# Patient Record
Sex: Male | Born: 1935 | Hispanic: No | Marital: Single | Smoking: Never smoker
Health system: Southern US, Community
[De-identification: ages and names within clinical notes are randomized; demographics above are authoritative.]

## PROBLEM LIST (undated history)

## (undated) DIAGNOSIS — J449 Chronic obstructive pulmonary disease, unspecified: Secondary | ICD-10-CM

## (undated) DIAGNOSIS — K5900 Constipation, unspecified: Secondary | ICD-10-CM

## (undated) DIAGNOSIS — I10 Essential (primary) hypertension: Secondary | ICD-10-CM

## (undated) DIAGNOSIS — I209 Angina pectoris, unspecified: Secondary | ICD-10-CM

## (undated) DIAGNOSIS — E079 Disorder of thyroid, unspecified: Secondary | ICD-10-CM

## (undated) DIAGNOSIS — K219 Gastro-esophageal reflux disease without esophagitis: Secondary | ICD-10-CM

## (undated) DIAGNOSIS — F419 Anxiety disorder, unspecified: Secondary | ICD-10-CM

---

## 2016-09-26 ENCOUNTER — Inpatient Hospital Stay
Admit: 2016-09-26 | Discharge: 2016-09-26 | Disposition: A | Discharge status: Home / Self Care | Payer: Medicare (Managed Care) | Attending: Internal Medicine | Admitting: Internal Medicine

## 2016-09-26 ENCOUNTER — Encounter: Payer: Self-pay | Admitting: Emergency Medicine

## 2016-09-26 ENCOUNTER — Emergency Department: Payer: Medicare (Managed Care)

## 2016-09-26 ENCOUNTER — Inpatient Hospital Stay
Admission: EM | Admit: 2016-09-26 | Discharge: 2016-09-26 | DRG: 291 | Disposition: A | Discharge status: Skilled Nursing Facility | Payer: Medicare (Managed Care) | Attending: Internal Medicine | Admitting: Internal Medicine

## 2016-09-26 DIAGNOSIS — M542 Cervicalgia: Secondary | ICD-10-CM | POA: Diagnosis present

## 2016-09-26 DIAGNOSIS — J9622 Acute and chronic respiratory failure with hypercapnia: Secondary | ICD-10-CM | POA: Diagnosis present

## 2016-09-26 DIAGNOSIS — E039 Hypothyroidism, unspecified: Secondary | ICD-10-CM | POA: Diagnosis present

## 2016-09-26 DIAGNOSIS — J810 Acute pulmonary edema: Secondary | ICD-10-CM | POA: Diagnosis present

## 2016-09-26 DIAGNOSIS — E782 Mixed hyperlipidemia: Secondary | ICD-10-CM | POA: Diagnosis present

## 2016-09-26 DIAGNOSIS — R739 Hyperglycemia, unspecified: Secondary | ICD-10-CM | POA: Diagnosis present

## 2016-09-26 DIAGNOSIS — I509 Heart failure, unspecified: Secondary | ICD-10-CM

## 2016-09-26 DIAGNOSIS — D696 Thrombocytopenia, unspecified: Secondary | ICD-10-CM | POA: Diagnosis present

## 2016-09-26 DIAGNOSIS — I5023 Acute on chronic systolic (congestive) heart failure: Secondary | ICD-10-CM | POA: Diagnosis present

## 2016-09-26 DIAGNOSIS — I10 Essential (primary) hypertension: Secondary | ICD-10-CM

## 2016-09-26 DIAGNOSIS — Z79899 Other long term (current) drug therapy: Secondary | ICD-10-CM

## 2016-09-26 DIAGNOSIS — Z9981 Dependence on supplemental oxygen: Secondary | ICD-10-CM

## 2016-09-26 DIAGNOSIS — D72829 Elevated white blood cell count, unspecified: Secondary | ICD-10-CM | POA: Diagnosis present

## 2016-09-26 DIAGNOSIS — J9602 Acute respiratory failure with hypercapnia: Secondary | ICD-10-CM

## 2016-09-26 DIAGNOSIS — R0689 Other abnormalities of breathing: Secondary | ICD-10-CM

## 2016-09-26 DIAGNOSIS — I248 Other forms of acute ischemic heart disease: Secondary | ICD-10-CM | POA: Diagnosis present

## 2016-09-26 DIAGNOSIS — I110 Hypertensive heart disease with heart failure: Secondary | ICD-10-CM | POA: Diagnosis present

## 2016-09-26 DIAGNOSIS — M546 Pain in thoracic spine: Secondary | ICD-10-CM | POA: Diagnosis present

## 2016-09-26 DIAGNOSIS — K219 Gastro-esophageal reflux disease without esophagitis: Secondary | ICD-10-CM | POA: Diagnosis present

## 2016-09-26 DIAGNOSIS — J9601 Acute respiratory failure with hypoxia: Secondary | ICD-10-CM

## 2016-09-26 DIAGNOSIS — J9621 Acute and chronic respiratory failure with hypoxia: Secondary | ICD-10-CM | POA: Diagnosis present

## 2016-09-26 DIAGNOSIS — J441 Chronic obstructive pulmonary disease with (acute) exacerbation: Secondary | ICD-10-CM | POA: Diagnosis present

## 2016-09-26 DIAGNOSIS — G9341 Metabolic encephalopathy: Secondary | ICD-10-CM | POA: Diagnosis present

## 2016-09-26 DIAGNOSIS — I2510 Atherosclerotic heart disease of native coronary artery without angina pectoris: Secondary | ICD-10-CM | POA: Diagnosis present

## 2016-09-26 DIAGNOSIS — R0603 Acute respiratory distress: Secondary | ICD-10-CM

## 2016-09-26 DIAGNOSIS — I4510 Unspecified right bundle-branch block: Secondary | ICD-10-CM | POA: Diagnosis present

## 2016-09-26 DIAGNOSIS — I5031 Acute diastolic (congestive) heart failure: Secondary | ICD-10-CM

## 2016-09-26 HISTORY — DX: Disorder of thyroid, unspecified: E07.9

## 2016-09-26 HISTORY — DX: Angina pectoris, unspecified: I20.9

## 2016-09-26 HISTORY — DX: Anxiety disorder, unspecified: F41.9

## 2016-09-26 HISTORY — DX: Chronic obstructive pulmonary disease, unspecified: J44.9

## 2016-09-26 HISTORY — DX: Essential (primary) hypertension: I10

## 2016-09-26 HISTORY — DX: Constipation, unspecified: K59.00

## 2016-09-26 HISTORY — DX: Gastro-esophageal reflux disease without esophagitis: K21.9

## 2016-09-26 LAB — BLOOD GAS, VENOUS
ACID-BASE EXCESS: 7 mmol/L — AB (ref 0.0–2.0)
BICARBONATE: 36.4 mmol/L — AB (ref 20.0–28.0)
Delivery systems: POSITIVE
FIO2: 0.4
PCO2 VEN: 83 mmHg — AB (ref 44.0–60.0)
Patient temperature: 37
pH, Ven: 7.25 (ref 7.250–7.430)

## 2016-09-26 LAB — COMPREHENSIVE METABOLIC PANEL
ALT: 15 U/L — AB (ref 17–63)
AST: 28 U/L (ref 15–41)
Albumin: 3.4 g/dL — ABNORMAL LOW (ref 3.5–5.0)
Alkaline Phosphatase: 51 U/L (ref 38–126)
Anion gap: 8 (ref 5–15)
BILIRUBIN TOTAL: 0.9 mg/dL (ref 0.3–1.2)
BUN: 20 mg/dL (ref 6–20)
CHLORIDE: 101 mmol/L (ref 101–111)
CO2: 31 mmol/L (ref 22–32)
CREATININE: 1.16 mg/dL (ref 0.61–1.24)
Calcium: 8.4 mg/dL — ABNORMAL LOW (ref 8.9–10.3)
GFR, EST NON AFRICAN AMERICAN: 52 mL/min — AB (ref >60)
Glucose, Bld: 142 mg/dL — ABNORMAL HIGH (ref 65–99)
POTASSIUM: 4.8 mmol/L (ref 3.5–5.1)
Sodium: 140 mmol/L (ref 135–145)
TOTAL PROTEIN: 6.6 g/dL (ref 6.5–8.1)

## 2016-09-26 LAB — AMMONIA: Ammonia: 14 umol/L (ref 9–35)

## 2016-09-26 LAB — TROPONIN I
TROPONIN I: 0.13 ng/mL — AB (ref <0.03)
Troponin I: 0.14 ng/mL (ref <0.03)

## 2016-09-26 LAB — HEMOGLOBIN A1C
Hgb A1c MFr Bld: 5.3 % (ref 4.8–5.6)
Hgb A1c MFr Bld: 5.3 % (ref 4.8–5.6)
MEAN PLASMA GLUCOSE: 105.41 mg/dL
Mean Plasma Glucose: 105.41 mg/dL

## 2016-09-26 LAB — PROTIME-INR
INR: 0.99
Prothrombin Time: 13.1 seconds (ref 11.4–15.2)

## 2016-09-26 LAB — CBC WITH DIFFERENTIAL/PLATELET
BASOS ABS: 0.1 10*3/uL (ref 0–0.1)
Basophils Relative: 1 %
EOS ABS: 0 10*3/uL (ref 0–0.7)
Eosinophils Relative: 0 %
HCT: 30.9 % — ABNORMAL LOW (ref 40.0–52.0)
Hemoglobin: 10 g/dL — ABNORMAL LOW (ref 13.0–18.0)
LYMPHS ABS: 0.9 10*3/uL — AB (ref 1.0–3.6)
Lymphocytes Relative: 7 %
MCH: 30.6 pg (ref 26.0–34.0)
MCHC: 32.5 g/dL (ref 32.0–36.0)
MCV: 94.1 fL (ref 80.0–100.0)
MONOS PCT: 4 %
Monocytes Absolute: 0.5 10*3/uL (ref 0.2–1.0)
Neutro Abs: 11 10*3/uL — ABNORMAL HIGH (ref 1.4–6.5)
Neutrophils Relative %: 88 %
PLATELETS: 132 10*3/uL — AB (ref 150–440)
RBC: 3.29 MIL/uL — AB (ref 4.40–5.90)
RDW: 15.7 % — AB (ref 11.5–14.5)
WBC: 12.5 10*3/uL — AB (ref 3.8–10.6)

## 2016-09-26 LAB — GLUCOSE, CAPILLARY
GLUCOSE-CAPILLARY: 139 mg/dL — AB (ref 65–99)
Glucose-Capillary: 116 mg/dL — ABNORMAL HIGH (ref 65–99)

## 2016-09-26 LAB — BRAIN NATRIURETIC PEPTIDE

## 2016-09-26 LAB — MRSA PCR SCREENING: MRSA by PCR: NEGATIVE

## 2016-09-26 LAB — APTT: aPTT: 30 seconds (ref 24–36)

## 2016-09-26 LAB — PROCALCITONIN

## 2016-09-26 MED ORDER — NITROGLYCERIN IN D5W 200-5 MCG/ML-% IV SOLN
0.0000 ug/min | Freq: Once | INTRAVENOUS | Status: AC
  Administered 2016-09-26: 30 ug/min via INTRAVENOUS

## 2016-09-26 MED ORDER — FUROSEMIDE 20 MG PO TABS: 40.0000 mg | ORAL_TABLET | Freq: Every day | ORAL | Status: DC

## 2016-09-26 MED ORDER — CHLORHEXIDINE GLUCONATE 0.12 % MT SOLN
15.0000 mL | Freq: Two times a day (BID) | OROMUCOSAL | Status: DC
  Administered 2016-09-26: 15 mL via OROMUCOSAL
  Filled 2016-09-26: qty 15

## 2016-09-26 MED ORDER — SODIUM CHLORIDE 0.9 % IV SOLN: 250.0000 mL | INTRAVENOUS | Status: DC | PRN

## 2016-09-26 MED ORDER — SODIUM CHLORIDE 0.9% FLUSH
3.0000 mL | Freq: Two times a day (BID) | INTRAVENOUS | Status: DC
  Administered 2016-09-26: 3 mL via INTRAVENOUS

## 2016-09-26 MED ORDER — ORAL CARE MOUTH RINSE: 15.0000 mL | Freq: Two times a day (BID) | OROMUCOSAL | Status: DC

## 2016-09-26 MED ORDER — METHYLPREDNISOLONE SODIUM SUCC 125 MG IJ SOLR
125.0000 mg | Freq: Once | INTRAMUSCULAR | Status: AC
  Administered 2016-09-26: 125 mg via INTRAVENOUS
  Filled 2016-09-26: qty 2

## 2016-09-26 MED ORDER — BUDESONIDE 0.25 MG/2ML IN SUSP
0.2500 mg | Freq: Two times a day (BID) | RESPIRATORY_TRACT | Status: DC
  Administered 2016-09-26: 0.25 mg via RESPIRATORY_TRACT
  Filled 2016-09-26: qty 2

## 2016-09-26 MED ORDER — MAGNESIUM SULFATE IN D5W 1-5 GM/100ML-% IV SOLN
1.0000 g | INTRAVENOUS | Status: AC
Start: 2016-09-26 — End: 2016-09-26
  Administered 2016-09-26: 1 g via INTRAVENOUS
  Filled 2016-09-26: qty 100

## 2016-09-26 MED ORDER — NITROGLYCERIN IN D5W 200-5 MCG/ML-% IV SOLN
0.0000 ug/min | INTRAVENOUS | Status: DC
  Administered 2016-09-26: 30 ug/min via INTRAVENOUS

## 2016-09-26 MED ORDER — SODIUM CHLORIDE 0.9% FLUSH
3.0000 mL | INTRAVENOUS | Status: DC | PRN
Start: 2016-09-26 — End: 2016-09-26

## 2016-09-26 MED ORDER — METHYLPREDNISOLONE SODIUM SUCC 40 MG IJ SOLR: 40.0000 mg | Freq: Two times a day (BID) | INTRAMUSCULAR | Status: DC

## 2016-09-26 MED ORDER — HYDRALAZINE HCL 20 MG/ML IJ SOLN
10.0000 mg | Freq: Once | INTRAMUSCULAR | Status: AC
  Administered 2016-09-26: 10 mg via INTRAVENOUS
  Filled 2016-09-26: qty 1

## 2016-09-26 MED ORDER — LEVOFLOXACIN 750 MG PO TABS
750.0000 mg | ORAL_TABLET | ORAL | Status: DC
  Administered 2016-09-26: 750 mg via ORAL
  Filled 2016-09-26: qty 1

## 2016-09-26 MED ORDER — DEXTROSE 5 % IV SOLN
500.0000 mg | INTRAVENOUS | Status: DC
  Filled 2016-09-26: qty 500

## 2016-09-26 MED ORDER — ONDANSETRON HCL 4 MG/2ML IJ SOLN: 4.0000 mg | Freq: Four times a day (QID) | INTRAMUSCULAR | Status: DC | PRN

## 2016-09-26 MED ORDER — ACETAMINOPHEN 650 MG RE SUPP: 650.0000 mg | Freq: Four times a day (QID) | RECTAL | Status: DC | PRN

## 2016-09-26 MED ORDER — FUROSEMIDE 40 MG PO TABS: 40.0000 mg | ORAL_TABLET | Freq: Every day | ORAL | 1 refills | Status: AC

## 2016-09-26 MED ORDER — ENOXAPARIN SODIUM 40 MG/0.4ML ~~LOC~~ SOLN
40.0000 mg | SUBCUTANEOUS | Status: DC
  Administered 2016-09-26: 40 mg via SUBCUTANEOUS
  Filled 2016-09-26: qty 0.4

## 2016-09-26 MED ORDER — FUROSEMIDE 10 MG/ML IJ SOLN
40.0000 mg | Freq: Once | INTRAMUSCULAR | Status: AC
  Administered 2016-09-26: 40 mg via INTRAVENOUS
  Filled 2016-09-26: qty 4

## 2016-09-26 MED ORDER — ACETAMINOPHEN 325 MG PO TABS: 650.0000 mg | ORAL_TABLET | Freq: Four times a day (QID) | ORAL | Status: DC | PRN

## 2016-09-26 MED ORDER — ASPIRIN 325 MG PO TBEC: 325.0000 mg | DELAYED_RELEASE_TABLET | Freq: Every day | ORAL | 0 refills | Status: AC

## 2016-09-26 MED ORDER — TIOTROPIUM BROMIDE MONOHYDRATE 18 MCG IN CAPS
18.0000 ug | ORAL_CAPSULE | Freq: Every day | RESPIRATORY_TRACT | Status: DC
  Filled 2016-09-26: qty 5

## 2016-09-26 MED ORDER — NITROGLYCERIN IN D5W 200-5 MCG/ML-% IV SOLN
30.0000 ug/min | Freq: Once | INTRAVENOUS | Status: AC
  Administered 2016-09-26: 30 ug/min via INTRAVENOUS
  Filled 2016-09-26: qty 250

## 2016-09-26 MED ORDER — ALBUTEROL SULFATE (2.5 MG/3ML) 0.083% IN NEBU
5.0000 mg | INHALATION_SOLUTION | Freq: Once | RESPIRATORY_TRACT | Status: AC
  Administered 2016-09-26: 5 mg via RESPIRATORY_TRACT
  Filled 2016-09-26: qty 6

## 2016-09-26 MED ORDER — IPRATROPIUM-ALBUTEROL 0.5-2.5 (3) MG/3ML IN SOLN
3.0000 mL | Freq: Once | RESPIRATORY_TRACT | Status: AC
  Administered 2016-09-26: 3 mL via RESPIRATORY_TRACT
  Filled 2016-09-26: qty 6

## 2016-09-26 MED ORDER — DEXTROSE 5 % IV SOLN
1.0000 g | INTRAVENOUS | Status: DC
  Filled 2016-09-26: qty 10

## 2016-09-26 MED ORDER — ASPIRIN EC 325 MG PO TBEC: 325.0000 mg | DELAYED_RELEASE_TABLET | Freq: Every day | ORAL | Status: DC

## 2016-09-26 MED ORDER — METOPROLOL TARTRATE 25 MG PO TABS: 12.5000 mg | ORAL_TABLET | Freq: Two times a day (BID) | ORAL | Status: DC

## 2016-09-26 MED ORDER — INSULIN ASPART 100 UNIT/ML ~~LOC~~ SOLN: 0.0000 [IU] | SUBCUTANEOUS | Status: DC

## 2016-09-26 MED ORDER — FUROSEMIDE 10 MG/ML IJ SOLN: 40.0000 mg | Freq: Once | INTRAMUSCULAR | Status: DC

## 2016-09-26 MED ORDER — ALBUTEROL SULFATE (2.5 MG/3ML) 0.083% IN NEBU
2.5000 mg | INHALATION_SOLUTION | Freq: Four times a day (QID) | RESPIRATORY_TRACT | Status: DC
  Administered 2016-09-26 (×2): 2.5 mg via RESPIRATORY_TRACT
  Filled 2016-09-26 (×2): qty 3

## 2016-09-26 MED ORDER — ONDANSETRON HCL 4 MG PO TABS: 4.0000 mg | ORAL_TABLET | Freq: Four times a day (QID) | ORAL | Status: DC | PRN

## 2016-09-26 MED ORDER — ENALAPRILAT 1.25 MG/ML IV SOLN: 1.2500 mg | Freq: Once | INTRAVENOUS | Status: DC

## 2016-09-26 NOTE — Care Management (Signed)
DNR form received by ICU RN from Dr. Victory Dakin [facility] provider.Code status updated in EPIC to DNR. Patient is on supplemental O2 at 6 Liter/[place] per ICU RN. CSW updated.

## 2016-09-26 NOTE — Progress Notes (Signed)
Pt off BiPap. On 6 liters [place], passed bedside swallow screen w/o s/sx distress

## 2016-09-26 NOTE — ED Notes (Signed)
Attempted to call report x 1  

## 2016-09-26 NOTE — Consult Note (Signed)
[facility] Cardiology Consultation Note  Patient ID: Richard Odom, MRN: [id number], DOB/AGE: 1920-10-22 83 y.o. Admit date: 09/26/2016   Date of Consult: 09/26/2016 Primary Physician: Dorothey Baseman, MD Primary Cardiologist: None  Chief Complaint:  Chief Complaint  Patient presents with  . Respiratory Distress   Reason for Consult: congestive heart failure  HPI: 83 y.o. male with known chronic obstructive pulmonary disease essential hypertension mixed hyperlipidemia who has had chronic systolic dysfunction heart failure and now has had severe shortness of breath weakness fatigue and chest discomfort over a 24-hour period. The patient has had a chest x-ray and further evaluation with a with suggestion of pulmonary edema and congestive heart failure. EKG has shown normal sinus rhythm with left atrial enlargement and right bundle branch block and inferior infarct age undetermined. This suggests previous coronary artery disease. The patient has had some significant diuresis and his breathing much better with less oxygen necessity. Previously the patient has been on appropriate medication management for risk factor modification including metoprolol and furosemide ACE inhibitor and aspirin.  Past Medical History:  Diagnosis Date  . Angina pectoris (HCC)   . Anxiety   . Constipation   . COPD (chronic obstructive pulmonary disease) (HCC)   . GERD (gastroesophageal reflux disease)   . Hypertension   . Thyroid disease       Surgical History: No past surgical history on file.   Home Meds: Prior to Admission medications   Medication Sig Start Date End Date Taking? Authorizing Provider  carvedilol (COREG CR) 10 MG 24 hr capsule Take 10 mg by mouth daily.   Yes [provider]  levothyroxine (SYNTHROID, LEVOTHROID) 25 MCG tablet Take 25 mcg by mouth daily before breakfast.   Yes [provider]  umeclidinium-vilanterol (ANORO ELLIPTA) 62.5-25 MCG/INH AEPB Inhale 1 puff  into the lungs daily.   Yes [provider]  acetaminophen (TYLENOL) 500 MG tablet Take 500 mg by mouth every 12 (twelve) hours as needed.    [provider]  albuterol (PROVENTIL HFA;VENTOLIN HFA) 108 (90 Base) MCG/ACT inhaler Inhale 2 puffs into the lungs every 4 (four) hours as needed for wheezing or shortness of breath.    [provider]  alum & mag hydroxide-simeth (GERI-LANTA) 200-200-20 MG/5ML suspension Take 10 mLs by mouth every 6 (six) hours as needed for indigestion or heartburn.    [provider]  dextromethorphan-guaiFENesin (MUCINEX DM) 30-600 MG 12hr tablet Take 1 tablet by mouth 2 (two) times daily as needed for cough.    [provider]  ipratropium-albuterol (DUONEB) 0.5-2.5 (3) MG/3ML SOLN Take 3 mLs by nebulization every 4 (four) hours as needed.    [provider]  lactulose (CHRONULAC) 10 GM/15ML solution Take 10 g by mouth daily as needed for mild constipation.    [provider]  loperamide (IMODIUM) 2 MG capsule Take 2 mg by mouth as needed for diarrhea or loose stools.    [provider]  LORazepam (ATIVAN) 2 MG/ML concentrated solution Take 0.5 mg by mouth every 2 (two) hours as needed for anxiety.    [provider]  magnesium hydroxide (MILK OF MAGNESIA) 400 MG/5ML suspension Take 15 mLs by mouth 2 (two) times daily as needed for mild constipation.    [provider]  Morphine Sulfate (MORPHINE CONCENTRATE) 10 mg / 0.5 ml concentrated solution Take by mouth every 2 (two) hours as needed for severe pain.    [provider]  nitroGLYCERIN (NITROSTAT) 0.4 MG SL tablet Place 0.4  mg under the tongue every 5 (five) minutes as needed for chest pain.    [provider]  ondansetron (ZOFRAN) 4 MG tablet Take 4 mg by mouth every 8 (eight) hours as needed for nausea or vomiting.    [provider]  sennosides-docusate sodium (SENOKOT-S) 8.6-50 MG tablet Take 1 tablet  by mouth daily as needed for constipation.    [provider]  sodium chloride (OCEAN) 0.65 % SOLN nasal spray Place 2 sprays into both nostrils every 4 (four) hours as needed for congestion.    [provider]    Inpatient Medications:  . albuterol  2.5 mg Nebulization Q6H  . aspirin EC  325 mg Oral Daily  . budesonide (PULMICORT) nebulizer solution  0.25 mg Nebulization BID  . chlorhexidine  15 mL Mouth Rinse BID  . enalaprilat  1.25 mg Intravenous Once  . enoxaparin (LOVENOX) injection  40 mg Subcutaneous Q24H  . insulin aspart  0-15 Units Subcutaneous Q4H  . mouth rinse  15 mL Mouth Rinse q12n4p  . methylPREDNISolone (SOLU-MEDROL) injection  40 mg Intravenous Q12H  . metoprolol tartrate  12.5 mg Oral BID  . sodium chloride flush  3 mL Intravenous Q12H   . sodium chloride    . [START ON 09/27/2016] azithromycin    . [START ON 09/27/2016] cefTRIAXone (ROCEPHIN)  IV    . nitroGLYCERIN 30 mcg/min (09/26/16 1209)    Allergies: No Known Allergies  Social History   Social History  . Marital status: Single    Spouse name: N/A  . Number of children: N/A  . Years of education: N/A   Occupational History  . Not on file.   Social History Main Topics  . Smoking status: Never Smoker  . Smokeless tobacco: Never Used  . Alcohol use No  . Drug use: No  . Sexual activity: Not on file   Other Topics Concern  . Not on file   Social History Narrative  . No narrative on file     No family history on file.   Review of Systems Positive for Shortness of breath cough congestion Negative for: General:  chills, fever, night sweats or weight changes.  Cardiovascular: PND orthopnea syncope dizziness  Dermatological skin lesions rashes Respiratory: Positive for Cough congestion Urologic: Frequent urination urination at night and hematuria Abdominal: negative for nausea, vomiting, diarrhea, bright red blood per rectum, melena, or hematemesis Neurologic: negative for  visual changes, and/or hearing changes  All other systems reviewed and are otherwise negative except as noted above.  Labs:  Recent Labs  09/26/16 0704 09/26/16 1055  TROPONINI 0.13* 0.14*   Lab Results  Component Value Date   WBC 12.5 (H) 09/26/2016   HGB 10.0 (L) 09/26/2016   HCT 30.9 (L) 09/26/2016   MCV 94.1 09/26/2016   PLT 132 (L) 09/26/2016    Recent Labs Lab 09/26/16 0704  NA 140  K 4.8  CL 101  CO2 31  BUN 20  CREATININE 1.16  CALCIUM 8.4*  PROT 6.6  BILITOT 0.9  ALKPHOS 51  ALT 15*  AST 28  GLUCOSE 142*   No results found for: CHOL, HDL, LDLCALC, TRIG No results found for: DDIMER  Radiology/Studies:  Dg Chest Portable 1 View  Result Date: 09/26/2016 CLINICAL DATA:  Shortness of breath, hypoxia.  History of COPD. EXAM: PORTABLE CHEST 1 VIEW COMPARISON:  None in PACs FINDINGS: The lungs are reasonably well inflated. Increased density is noted at both lung bases and there are small bilateral  pleural effusions. The cardiac silhouette is the large but the heart borders are indistinct. The pulmonary vascularity is mildly engorged. There is calcification in the wall of the aortic arch. The bony thorax exhibits no acute abnormality. IMPRESSION: CHF with interstitial edema and bilateral pleural effusions. Coarse bibasilar lung markings may reflect atelectasis or pneumonia. When the patient can tolerate the procedure, a PA and lateral chest x-ray would be useful. Thoracic aortic atherosclerosis. Electronically Signed   By: David  [place] M.D.   On: 09/26/2016 07:22    EKG: Normal sinus rhythm with left atrial enlargement and right bundle branch block with inferior infarct age undetermined  Weights: Filed Weights   09/26/16 0702 09/26/16 1000  Weight: 72.6 kg (160 lb) 61.2 kg (134 lb 14.7 oz)     Physical Exam: Blood pressure (!) 139/57, pulse 68, temperature 97.8 F (36.6 C), temperature source Axillary, resp. rate (!) 25, height 5\' 5"  (1.651 m), weight 61.2 kg  (134 lb 14.7 oz), SpO2 95 %. Body mass index is 22.45 kg/m. General: Well developed, well nourished, in no acute distress. Head eyes ears nose throat: Normocephalic, atraumatic, sclera non-icteric, no xanthomas, nares are without discharge. No apparent thyromegaly and/or mass  Lungs: Normal respiratory effort.  no wheezes, Basilar rales, no rhonchi.  Heart: RRR with normal S1 S2. no murmur gallop, no rub, PMI is normal size and placement, carotid upstroke normal without bruit, jugular venous pressure is normal Abdomen: Soft, non-tender, non-distended with normoactive bowel sounds. No hepatomegaly. No rebound/guarding. No obvious abdominal masses. Abdominal aorta is normal size without bruit Extremities: 1+ edema. no cyanosis, no clubbing, no ulcers  Peripheral : 2+ bilateral upper extremity pulses, 2+ bilateral femoral pulses, 2+ bilateral dorsal pedal pulse Neuro: Alert and oriented. No facial asymmetry. No focal deficit. Moves all extremities spontaneously. Musculoskeletal: Normal muscle tone without kyphosis Psych:  Responds to questions appropriately with a normal affect.    Assessment: 83 year old male with the acute on chronic systolic dysfunction congestive heart failure cardiovascular disease essential hypertension mixed hyperlipidemia no with elevated troponin consistent with demand ischemia rather than acute coronary syndrome  Plan: 1. Continue intravenous Lasix for further risk reduction and treatment of congestive heart failure 2. Consider echocardiogram for further changes in LV systolic dysfunction valvular heart disease contributing to above 3. ACE inhibitor beta blocker and aspirin for further risk reduction cardiovascular disease and/or congestive heart failure 4. Begin ambulation when able following for further significant symptoms and further need for adjustments of medication management 5. No further intervention of minimal elevation of troponin more consistent with demand  ischemia rather than acute coronary syndrome  Signed, Lamar Blinks M.D. [facility] Cardiology 09/26/2016, 1:03 PM

## 2016-09-26 NOTE — Progress Notes (Signed)
Report called to Kim at [facility].  Pt will transfer via EMS.  Transfer packet at the desk. Patient on 6 liters [place], EMS informed.  Writer dressed patient in his clothing and removed his IVs, bleeding controlled.  No pain issues; he is waiting in his room for EMS.  Oxygen tubing, shoes and socks and quad cane at bed side to go with him to [facility].

## 2016-09-26 NOTE — ED Notes (Signed)
Richard Odom, from [facility], at bedside. Requesting her number to be left.  [phone].

## 2016-09-26 NOTE — ED Triage Notes (Signed)
Patient presents to ED via [facility] from [facility], emergency traffic. Staff at [facility] called out due to patients SpO2 80% on RA. History of COPD. Patient wears 4L of O2 chronically. Patient placed on bipap upon arrival to ED. EMS placed 1.5 inch of nitro paste to patients chest. EMS report end tidal CO2 30.

## 2016-09-26 NOTE — Progress Notes (Signed)
Per Richard Odom I&O cath patient for 554 mls in bladder, he cannot void after multiple attempts

## 2016-09-26 NOTE — ED Notes (Signed)
1.5 inch of nitro paste removed from patients left chest by this RN per verbal order from Dr. Scotty Court prior to nitro gtt.

## 2016-09-26 NOTE — Progress Notes (Signed)
*  PRELIMINARY RESULTS* Echocardiogram 2D Echocardiogram has been performed.  Richard Odom 09/26/2016, 1:48 PM

## 2016-09-26 NOTE — ED Provider Notes (Addendum)
[facility] Emergency Department Provider Note  ____________________________________________  Time seen: Approximately 7:43 AM  I have reviewed the triage vital signs and the nursing notes.   HISTORY  Chief Complaint Respiratory Distress  Level 5 caveat:  Portions of the history and physical were unable to be obtained due to: Respiratory distress and critical illness   HPI Richard Odom is a 83 y.o. male sent to the ED from [facility] due to respiratory distress. Oxygen saturation was 80% on room air. Has a history of COPD and wears 4 L nasal cannula at home. Today his breathing was much worse with shortness of breath that started overnight associated with severe upper back pain. No cough fever or chills. No aggravating or alleviating factors. EMS put the patient on CPAP and placed Nitropaste to the chest.   Patient is full code  Past Medical History:  Diagnosis Date  . Angina pectoris (HCC)   . Anxiety   . Constipation   . COPD (chronic obstructive pulmonary disease) (HCC)   . GERD (gastroesophageal reflux disease)   . Hypertension   . Thyroid disease      There are no active problems to display for this patient.    No past surgical history on file. Noncontributory  Prior to Admission medications   Not on File   Albuterol Synthroid Nitrostat Lactulose Ativan when necessary Coreg   Allergies Patient has no known allergies.   No family history on file.  Social History Social History  Substance Use Topics  . Smoking status: Never Smoker  . Smokeless tobacco: Never Used  . Alcohol use No    Review of Systems  Constitutional:   No fever or chills.   Cardiovascular:   No chest pain or syncope. Respiratory:  Positive shortness of breath without cough. Gastrointestinal:   Negative for abdominal pain, vomiting and diarrhea.  Musculoskeletal:   Positive upper back pain, no swelling All other systems reviewed and are  negative except as documented above in ROS and HPI.  ____________________________________________   PHYSICAL EXAM:  VITAL SIGNS: ED Triage Vitals  Enc Vitals Group     BP 09/26/16 0710 (!) 172/64     Pulse Rate 09/26/16 0700 65     Resp 09/26/16 0700 (!) 22     Temp 09/26/16 0700 (!) 97.3 F (36.3 C)     Temp Source 09/26/16 0700 Axillary     SpO2 09/26/16 0700 92 %     Weight 09/26/16 0702 160 lb (72.6 kg)     Height 09/26/16 0702 5\' 7"  (1.702 m)     Head Circumference --      [facility] Flow --      Pain Score 09/26/16 0713 10     Pain Loc --      Pain Edu? --      Excl. in GC? --     Vital signs reviewed, nursing assessments reviewed.   Constitutional:   Alert and oriented. Respiratory distress Eyes:   No scleral icterus.  EOMI. No nystagmus. No conjunctival pallor. PERRL. ENT   Head:   Normocephalic and atraumatic.   Nose:   No congestion/rhinnorhea.    Mouth/Throat:   MMM, no pharyngeal erythema. No peritonsillar mass.    Neck:   No meningismus. Full ROM Hematological/Lymphatic/Immunilogical:   No cervical lymphadenopathy. Cardiovascular:   RRR. Symmetric bilateral radial and DP pulses.  No murmurs.  Respiratory:   Increased work of breathing with tachypnea. Diminished breath sounds in all lung fields, diffuse  expiratory wheezing. Crackles in both bases.. Gastrointestinal:   Soft and nontender. Non distended. There is no CVA tenderness.  No rebound, rigidity, or guarding. Genitourinary:   deferred Musculoskeletal:   Normal range of motion in all extremities. No joint effusions.  No lower extremity tenderness.  No edema. Neurologic:   Normal speech and language.  Motor grossly intact. No gross focal neurologic deficits are appreciated.  Skin:    Skin is warm, dry and intact. No rash noted.  No petechiae, purpura, or bullae.  ____________________________________________    LABS (pertinent positives/negatives) (all labs ordered are listed, but only  abnormal results are displayed) Labs Reviewed  BLOOD GAS, VENOUS - Abnormal; Notable for the following:       Result Value   pCO2, Ven 83 (*)    Bicarbonate 36.4 (*)    Acid-Base Excess 7.0 (*)    All other components within normal limits  CBC WITH DIFFERENTIAL/PLATELET - Abnormal; Notable for the following:    WBC 12.5 (*)    RBC 3.29 (*)    Hemoglobin 10.0 (*)    HCT 30.9 (*)    RDW 15.7 (*)    Platelets 132 (*)    All other components within normal limits  COMPREHENSIVE METABOLIC PANEL  TROPONIN I   ____________________________________________   EKG  Interpreted by me Sinus rhythm rate of 59, normal axis, slightly widened QRS, right bundle branch block. No acute ischemic changes. Poor baseline somewhat limits interpretation, but not consistent with STEMI.  ____________________________________________    RADIOLOGY  Dg Chest Portable 1 View  Result Date: 09/26/2016 CLINICAL DATA:  Shortness of breath, hypoxia.  History of COPD. EXAM: PORTABLE CHEST 1 VIEW COMPARISON:  None in PACs FINDINGS: The lungs are reasonably well inflated. Increased density is noted at both lung bases and there are small bilateral pleural effusions. The cardiac silhouette is the large but the heart borders are indistinct. The pulmonary vascularity is mildly engorged. There is calcification in the wall of the aortic arch. The bony thorax exhibits no acute abnormality. IMPRESSION: CHF with interstitial edema and bilateral pleural effusions. Coarse bibasilar lung markings may reflect atelectasis or pneumonia. When the patient can tolerate the procedure, a PA and lateral chest x-ray would be useful. Thoracic aortic atherosclerosis. Electronically Signed   By: David  [place] M.D.   On: 09/26/2016 07:22    ____________________________________________   PROCEDURES Procedures CRITICAL CARE Performed by: Scotty Court, Aryssa Rosamond   Total critical care time: 35 minutes  Critical care time was exclusive of  separately billable procedures and treating other patients.  Critical care was necessary to treat or prevent imminent or life-threatening deterioration.  Critical care was time spent personally by me on the following activities: development of treatment plan with patient and/or surrogate as well as nursing, discussions with consultants, evaluation of patient's response to treatment, examination of patient, obtaining history from patient or surrogate, ordering and performing treatments and interventions, ordering and review of laboratory studies, ordering and review of radiographic studies, pulse oximetry and re-evaluation of patient's condition.  ____________________________________________   INITIAL IMPRESSION / ASSESSMENT AND PLAN / ED COURSE  Pertinent labs & imaging results that were available during my care of the patient were reviewed by me and considered in my medical decision making (see chart for details).    Clinical Course as of Sep 26 741  Thu Sep 26, 2016  0702 P/w respiratory distress, diminished air movement and wheezy, suspect COPD exac, ?pulm. Edema. Will f/u CXR and response to BDs.   [  PS]    Clinical Course User Index [PS] Sharman Cheek, MD     ----------------------------------------- 7:51 AM on 09/26/2016 -----------------------------------------  Patient arrived on CPAP from EMS, transition immediately to BiPAP on arrival. Continue nitro paste, bronchodilators, steroids. Chest x-ray consistent with pulmonary edema. Added on IV Lasix and IV enalapril. Case discussed with hospitalist for admission.   ----------------------------------------- 8:04 AM on 09/26/2016 -----------------------------------------  Troponin elevated. Non-STEMI versus strain. We'll replace the Nitropaste with a nitro drip neck and be titrated to pain relief and blood pressure control for cardiac protection and relief of potential ischemia. We'll defer heparinization for  now.. ____________________________________________   FINAL CLINICAL IMPRESSION(S) / ED DIAGNOSES  Final diagnoses:  Respiratory distress  Acute pulmonary edema (HCC)  Acute respiratory failure with hypoxia and hypercapnia (HCC)  Acute on chronic congestive heart failure, unspecified heart failure type [facility])      New Prescriptions   No medications on file     Portions of this note were generated with dragon dictation software. Dictation errors may occur despite best attempts at proofreading.    Sharman Cheek, MD 09/26/16 1610    Sharman Cheek, MD 09/26/16 [phone]

## 2016-09-26 NOTE — Progress Notes (Signed)
Anticoagulation Monitoring:   58 you male ordered enoxaparin 30mg  Daily for DVT prophylaxis. Patients estimated creatinine clearance > 79mL/min. Will transition patient to enoxaparin 40mg  Daily.   Pharmacy will continue to monitor and adjust per protocol.   MLS

## 2016-09-26 NOTE — ED Notes (Signed)
Bed assigned

## 2016-09-26 NOTE — ED Notes (Signed)
X-ray at bedside

## 2016-09-26 NOTE — Care Management (Addendum)
RNCM received call from [facility] Lajuana Matte MD [phone]. He is from [facility] and was supposed to be on comfort care. They are disappointed that he was sent here for treatment. This is the second time this has occurred per Dr. Victory Dakin. Per provider they have had at length conversations with family and they have agreed to comfort care. Per Dr. Victory Dakin she would like to talk with ICU provider and get patient back to [facility] on Bipap (which she states she can arrange) today.  I have forwarded this request to CSW and Annabelle Harman NP.  ICU RN updated. Conflict with code status per Dr. Victory Dakin. Per Dr. Victory Dakin patient does not have capacity to make health care decisions and that he is supposed to be a DNR. MOST from brought with patient "updated on 09/13/16" does not have MD/physician signature so it is not valid. I spoke with Dr. Victory Dakin about this information and she will fax current code status for this patient. Per RN charge patient speaks some English however it is difficult to tell if he has capacity.

## 2016-09-26 NOTE — ED Notes (Signed)
Attempting to call report at this time. 

## 2016-09-26 NOTE — Progress Notes (Signed)
Pharmacy Antibiotic Note  Richard Odom is a 83 y.o. male admitted on 09/26/2016 with pneumonia.  Pharmacy has been consulted for levofloxacin dosing.  Plan: Will transition patient to levofloxacin 750mg  PO Q48hr. Will discuss on rounds utility of levofloxacin in setting of possible CHF exacerbation.   Height: 5\' 7"  (170.2 cm) Weight: 160 lb (72.6 kg) IBW/kg (Calculated) : 66.1  Temp (24hrs), Avg:97.3 F (36.3 C), Min:97.3 F (36.3 C), Max:97.3 F (36.3 C)   Recent Labs Lab 09/26/16 0704  WBC 12.5*  CREATININE 1.16    Estimated Creatinine Clearance: 36.4 mL/min (by C-G formula based on SCr of 1.16 mg/dL).    No Known Allergies  Antimicrobials this admission: levofloxacin 8/23 >>    Dose adjustments this admission: 8/23 levofloxacin transitioned to 750mg  PO Q48hr.   Microbiology results: 8/23 MRSA PCR: pending  8/23 Procalcitonin: pending   Thank you for allowing pharmacy to be a part of this patient's care.  Simpson,Michael L 09/26/2016 9:36 AM

## 2016-09-26 NOTE — H&P (Addendum)
[facility] - [place] at [facility]   PATIENT NAME: Richard Odom    MR#:  [id number]  DATE OF BIRTH:  [birth year]  DATE OF ADMISSION:  09/26/2016  PRIMARY CARE PHYSICIAN: Dorothey Baseman, MD   REQUESTING/REFERRING PHYSICIAN:   CHIEF COMPLAINT:   Chief Complaint  Patient presents with  . Respiratory Distress    HISTORY OF PRESENT ILLNESS: Richard Odom  is a 83 y.o. male with a known history of COPD, HTN, Angina, who presents to the hospital with complaints of back and neck pain, which started yesterday evening. He denied any chest pains, however, admitted of back and neck pains mostly, he is not able to provide much more history sincce he is confused. He was noted to be hypoxic, tachypneic, initiated on BiPAP. He feels somewhat better now. His blood pressure was noted to be 170-180 systolic. Patient's x-ray revealed congestive heart failure, questionable atelectasis versus pneumonia. Patient denies any cough or phlegm production. Hospitalist services were contacted for admission  PAST MEDICAL HISTORY:   Past Medical History:  Diagnosis Date  . Angina pectoris (HCC)   . Anxiety   . Constipation   . COPD (chronic obstructive pulmonary disease) (HCC)   . GERD (gastroesophageal reflux disease)   . Hypertension   . Thyroid disease     PAST SURGICAL HISTORY: No past surgical history on file.  SOCIAL HISTORY:  Social History  Substance Use Topics  . Smoking status: Never Smoker  . Smokeless tobacco: Never Used  . Alcohol use No    FAMILY HISTORY: No family history on file.  DRUG ALLERGIES: No Known Allergies  Review of Systems  Unable to perform ROS: Mental status change    MEDICATIONS AT HOME:  Prior to Admission medications   Not on File      PHYSICAL EXAMINATION:   VITAL SIGNS: Blood pressure (!) 172/70, pulse (!) 105, temperature (!) 97.3 F (36.3 C), temperature source Axillary, resp. rate (!) 22, height 5\' 7"  (1.702 m),  weight 72.6 kg (160 lb), SpO2 100 %.  GENERAL:  83 y.o.-year-old patient lying in the bed In moderate respiratory distress, on BiPAP, tachypneic, uncomfortable.  EYES: Pupils equal, round, reactive to light and accommodation. No scleral icterus. Extraocular muscles intact.  HEENT: Head atraumatic, normocephalic. Oropharynx and nasopharynx clear.  NECK:  Supple, no jugular venous distention. No thyroid enlargement, no tenderness.  LUNGS: Diminished breath sounds bilaterally, scattered wheezing, bilateral rales,rhonchi and crepitations all lung fields. Intermittent use of accessory muscles of respiration, with speech or movements.  CARDIOVASCULAR: S1, S2 normal, distant. No murmurs, rubs, or gallops.  ABDOMEN: Soft, nontender, nondistended, full bladder. Bowel sounds present. No organomegaly or mass.  EXTREMITIES: 1-2+ lower extremity and pedal edema, no cyanosis, or clubbing. Peripheral pulses are diminished NEUROLOGIC: Cranial nerves II through XII are intact. Muscle strength 5/5 in all extremities. Sensation intact. Gait not checked.  PSYCHIATRIC: The patient is alert , not oriented, he is talking about going home, since his daughter is about to visit him from [place], although he is on BiPAP SKIN: No obvious rash, lesion, or ulcer.   LABORATORY PANEL:   CBC  Recent Labs Lab 09/26/16 0704  WBC 12.5*  HGB 10.0*  HCT 30.9*  PLT 132*  MCV 94.1  MCH 30.6  MCHC 32.5  RDW 15.7*  LYMPHSABS 0.9*  MONOABS 0.5  EOSABS 0.0  BASOSABS 0.1   ------------------------------------------------------------------------------------------------------------------  Chemistries   Recent Labs Lab 09/26/16 0704  NA 140  K 4.8  CL  101  CO2 31  GLUCOSE 142*  BUN 20  CREATININE 1.16  CALCIUM 8.4*  AST 28  ALT 15*  ALKPHOS 51  BILITOT 0.9   ------------------------------------------------------------------------------------------------------------------  Cardiac Enzymes  Recent Labs Lab  09/26/16 0704  TROPONINI 0.13*   ------------------------------------------------------------------------------------------------------------------  RADIOLOGY: Dg Chest Portable 1 View  Result Date: 09/26/2016 CLINICAL DATA:  Shortness of breath, hypoxia.  History of COPD. EXAM: PORTABLE CHEST 1 VIEW COMPARISON:  None in PACs FINDINGS: The lungs are reasonably well inflated. Increased density is noted at both lung bases and there are small bilateral pleural effusions. The cardiac silhouette is the large but the heart borders are indistinct. The pulmonary vascularity is mildly engorged. There is calcification in the wall of the aortic arch. The bony thorax exhibits no acute abnormality. IMPRESSION: CHF with interstitial edema and bilateral pleural effusions. Coarse bibasilar lung markings may reflect atelectasis or pneumonia. When the patient can tolerate the procedure, a PA and lateral chest x-ray would be useful. Thoracic aortic atherosclerosis. Electronically Signed   By: David  [place] M.D.   On: 09/26/2016 07:22    EKG: Orders placed or performed during the hospital encounter of 09/26/16  . ED EKG  . ED EKG  . EKG 12-Lead  . EKG 12-Lead  . EKG 12-Lead  . EKG 12-Lead  . EKG 12-Lead  . EKG 12-Lead   EKG in the emergency room revealed sinus rhythm at 59 bpm, right bundle branch block, inferior infarct age undetermined, anterolateral infarct, age undetermined, nonspecific ST-T changes IMPRESSION AND PLAN:  Active Problems:   Acute respiratory failure with hypoxia and hypercapnia (HCC)   Acute pulmonary edema (HCC)   Acute diastolic CHF (congestive heart failure) (HCC)   Malignant essential hypertension   #1. Acute respiratory failure with hypoxia and hypercapnia, the patient to medical floor, continue BiPAP, his pulmonologist/intensivist to see patient in consultation. Discussed with Dr. Sung Amabile. .  #2. Acute pulmonary edema, continue Lasix, nitroglycerin intravenously, follow ins  and outs, recheck chest x-ray in the morning, get echocardiogram #3. Malignant essential hypertension, initiate patient on nitroglycerin intravenously, changed to oral medications as appropriate #4. Elevated troponin, likely demand ischemia, initiate aspirin, nitroglycerin intravenously, Lovenox, low-dose of metoprolol, follow cardiac enzymes 3, good cardiologist involved for further recommendations, get echo #5. Hyperglycemia, check hemoglobin A1c #6. Metabolic encephalopathy, get ammonia level, follow clinically #7. Elevated white blood cell count, etiology is unclear, questionable pneumonia, initiate patient on levofloxacin orally, get sputum cultures if available #8 Thrombocytopenia, follow with therapy, get pro time INR, PTT All the records are reviewed and case discussed with ED provider. Management plans discussed with the patient, family and they are in agreement.  CODE STATUS: Code Status History    This patient does not have a recorded code status. Please follow your organizational policy for patients in this situation.    Advance Directive Documentation     Most Recent Value  Type of Advance Directive  Out of facility DNR (pink MOST or yellow form)  Pre-existing out of facility DNR order (yellow form or pink MOST form)  -  "MOST" Form in Place?  -       TOTAL Critical care TIME TAKING CARE OF THIS PATIENT: 60 minutes.    Katharina Caper M.D on 09/26/2016 at 8:07 AM  Between 7am to 6pm - Pager - [phone] After 6pm go to [email or web address] - password [email or web address] [facility]  [facility]  Office  [phone]  CC: Primary care physician; Dorothey Baseman, MD

## 2016-09-26 NOTE — Progress Notes (Signed)
PT Cancellation Note  Patient Details Name: Tomohiro Mazzucco MRN: [id number] DOB: [birth year]   Cancelled Treatment:    Reason Eval/Treat Not Completed: Other (comment) Upon chart review, noted that pt is supposed to be comfort care at [facility]. Spoke with nursing to see if pt is appropriate for therapy; she reported that pt is on comfort care and will likely be discharging back to [facility] today. Will d/c inhouse at this time. If there is a change in pt status, please resubmit PT orders.   Latanya Maudlin 09/26/2016, 1:57 PM

## 2016-09-26 NOTE — Clinical Social Work Note (Signed)
Clinical Social Work Assessment  Patient Details  Name: Richard Odom MRN: [id number] Date of Birth: 12-01-1920  Date of referral:  09/26/16               Reason for consult:                   Permission sought to share information with:    Permission granted to share information::     Name::        Agency::     Relationship::     Contact Information:     Housing/Transportation Living arrangements for the past 2 months:  Skilled Nursing Facility Source of Information:  Facility Patient Interpreter Needed:  None (Patient speaks minimal english and has been deemed having no capacity by [facility] MD Dr. Victory Dakin) Criminal Activity/Legal Involvement Pertinent to Current Situation/Hospitalization:  No - Comment as needed Significant Relationships:    Lives with:  Facility Resident Do you feel safe going back to the place where you live?    Need for family participation in patient care:     Care giving concerns:  Patient is a long term resident at [facility].   Social Worker assessment / plan:  CSW informed by RN CM that patient was admitted from [facility] and that [facility] physician Dr. Victory Dakin is contacting Annabelle Harman, NP in ICU to coordinate getting patient transferred back to [facility] today. Patient is comfort care and was to be comfort care prior to being admitted to ICU. Patient to transport via EMS. [facility] at [facility] is aware.  Employment status:    Insurance information:    PT Recommendations:    Information / Referral to community resources:     Patient/Family's Response to care:  [facility] has coordinated with family that patient is to return to [facility] today with comfort care.  Patient/Family's Understanding of and Emotional Response to Diagnosis, Current Treatment, and Prognosis:  See above.  Emotional Assessment Appearance:    Attitude/Demeanor/Rapport:  Other Affect (typically observed):  Calm Orientation:    Alcohol / Substance use:    Psych involvement (Current and /or in the  community):  No (Comment)  Discharge Needs  Concerns to be addressed:  Care Coordination Readmission within the last 30 days:  No Current discharge risk:  None Barriers to Discharge:  No Barriers Identified   York Spaniel, LCSW 09/26/2016, 3:41 PM

## 2016-09-26 NOTE — Discharge Summary (Signed)
Physician Discharge Summary  Patient ID: Richard Odom MRN: [id number] DOB/AGE: September 10, 1920 83 y.o.  Admit date: 09/26/2016 Discharge date: 09/26/2016    Discharge Diagnoses:       Acute on chronic hypoxic hypercapnic respiratory failure secondary to pulmonary      edema and AECOPD (procalcitonin negative)      Hypertension       Mild leukocytosis       Elevated troponin's secondary to demand ischemia due to respiratory failure and      hypertension       Neck and Back Pain       Hypothyroidism                                                                        DISCHARGE PLAN BY DIAGNOSIS    Acute on chronic hypoxic hypercapnic respiratory failure secondary to pulmonary edema and AECOPD (procalcitonin negative) Mild leukocytosis  Plan: Continue outpatient noro ellipta, mucinex, prn albuterol inhaler, and prn duonebs Continue outpatient home O2 @ 4-6L via nasal canula  Prn Bipap for dyspnea-(settings: 12/5 and 50%) Procalcitonin negative, therefore will not discharge on antibiotic treatment Will add 40 mg po lasix daily  Pt is followed by [facility] program I have spoken to Dr. Victory Dakin she will manage pt at discharge   Hypertension  Mildly elevated troponin's secondary to demand ischemia due to respiratory failure and hypertension  Plan: Continue carvedilol and prn sublingual nitroglycerin for chest pain   Hypothyroidism  Plan: Continue po synthroid   Neck and Back Pain  Plan: Continue prn acetaminophen and morphine sulfate for pain management          DISCHARGE SUMMARY   Richard Odom is a 83 y.o. y/o male with a PMH of Hypothyroidism, HTN, GERD, COPD, Constipation, Anxiety, and Angina.  He presented to [facility] ER 08/23 via EMS from [facility] with shortness of breath and hypoxia O2 sats in the 80's on RA.  The pt wears chronic home O2 at 4L via nasal canula he developed worsening shortness of breath with severe upper back pain onset of symptoms the night of 08/22.  Upon  EMS arrival due to acute respiratory failure pt was placed on CPAP and Nitropaste applied due to hypertension.  In the ER lab results revealed mildly elevated troponin 0.13, wbc 12.5, and hgb 10.0.  He was placed on continuous Bipap due to respiratory failure and nitroglycerin gtt due to hypertension and admitted to the [facility].  However, pt is currently off nitroglycerin gtt bp stable and transitioned off  continuous Bipap.  I spoke with Dr. Victory Dakin she states the pt is a DO NOT RESUSCITATE, Comfort Care Only, and a [facility] pts MOST form is in pts chart. Therefore, she is requesting the pt be discharged back to [facility] today 08/23. Cardiology evaluated pt due to mildly elevated troponin and determined no further intervention needed elevated troponin more consistent with demand ischemia rather than acute coronary syndrome.    SIGNIFICANT DIAGNOSTIC STUDIES Echo 08/23>>  SIGNIFICANT EVENTS 08/23-Pt admitted to [facility]  MICRO DATA  Sputum 08/23>>  ANTIBIOTICS None   CONSULTS Intensivist  Cardiology   TUBES / LINES None   Discharge Exam: General: well developed, well nourished male, resting in  bed, in NAD. Neuro: Alert disoriented to situation, non-focal.  HEENT: /AT. PERRL, sclerae anicteric. Cardiovascular: RRR, no M/R/G.  Lungs: Respirations even and unlabored; rhonchi and crackles throughout although improved post lasix  Abdomen: BS x 4, soft, NT/ND.  Musculoskeletal: No gross deformities, 2+ bilateral lower extremity edema  Skin: Intact, warm, no rashes.   Vitals:   09/26/16 1101 09/26/16 1200 09/26/16 1300 09/26/16 1400  BP:  (!) 139/57 (!) 137/55 (!) 148/53  Pulse:  68 61 62  Resp:  (!) 25 (!) 26 (!) 37  Temp:  97.8 F (36.6 C)    TempSrc:  Axillary    SpO2: 95% 95% 93% 94%  Weight:      Height:         Discharge Labs  BMET  Recent Labs Lab 09/26/16 0704  NA 140  K 4.8  CL 101  CO2 31  GLUCOSE 142*  BUN 20  CREATININE 1.16  CALCIUM 8.4*     CBC  Recent Labs Lab 09/26/16 0704  HGB 10.0*  HCT 30.9*  WBC 12.5*  PLT 132*    Anti-Coagulation  Recent Labs Lab 09/26/16 0823  INR 0.99    Discharge Instructions    Diet - low sodium heart healthy    Complete by:  As directed    Increase activity slowly    Complete by:  As directed           Allergies as of 09/26/2016   No Known Allergies     Medication List    TAKE these medications   acetaminophen 500 MG tablet Commonly known as:  TYLENOL Take 500 mg by mouth every 12 (twelve) hours as needed.   albuterol 108 (90 Base) MCG/ACT inhaler Commonly known as:  PROVENTIL HFA;VENTOLIN HFA Inhale 2 puffs into the lungs every 4 (four) hours as needed for wheezing or shortness of breath.   ANORO ELLIPTA 62.5-25 MCG/INH Aepb Generic drug:  umeclidinium-vilanterol Inhale 1 puff into the lungs daily.   aspirin 325 MG EC tablet Take 1 tablet (325 mg total) by mouth daily.   carvedilol 10 MG 24 hr capsule Commonly known as:  COREG CR Take 10 mg by mouth daily.   dextromethorphan-guaiFENesin 30-600 MG 12hr tablet Commonly known as:  MUCINEX DM Take 1 tablet by mouth 2 (two) times daily as needed for cough.   furosemide 40 MG tablet Commonly known as:  LASIX Take 1 tablet (40 mg total) by mouth daily.   GERI-LANTA 200-200-20 MG/5ML suspension Generic drug:  alum & mag hydroxide-simeth Take 10 mLs by mouth every 6 (six) hours as needed for indigestion or heartburn.   ipratropium-albuterol 0.5-2.5 (3) MG/3ML Soln Commonly known as:  DUONEB Take 3 mLs by nebulization every 4 (four) hours as needed.   lactulose 10 GM/15ML solution Commonly known as:  CHRONULAC Take 10 g by mouth daily as needed for mild constipation.   levothyroxine 25 MCG tablet Commonly known as:  SYNTHROID, LEVOTHROID Take 25 mcg by mouth daily before breakfast.   loperamide 2 MG capsule Commonly known as:  IMODIUM Take 2 mg by mouth as needed for diarrhea or loose stools.    LORazepam 2 MG/ML concentrated solution Commonly known as:  ATIVAN Take 0.5 mg by mouth every 2 (two) hours as needed for anxiety.   magnesium hydroxide 400 MG/5ML suspension Commonly known as:  MILK OF MAGNESIA Take 15 mLs by mouth 2 (two) times daily as needed for mild constipation.   morphine CONCENTRATE 10 mg / 0.5  ml concentrated solution Take by mouth every 2 (two) hours as needed for severe pain.   nitroGLYCERIN 0.4 MG SL tablet Commonly known as:  NITROSTAT Place 0.4 mg under the tongue every 5 (five) minutes as needed for chest pain.   ondansetron 4 MG tablet Commonly known as:  ZOFRAN Take 4 mg by mouth every 8 (eight) hours as needed for nausea or vomiting.   sennosides-docusate sodium 8.6-50 MG tablet Commonly known as:  SENOKOT-S Take 1 tablet by mouth daily as needed for constipation.   sodium chloride 0.65 % Soln nasal spray Commonly known as:  OCEAN Place 2 sprays into both nostrils every 4 (four) hours as needed for congestion.            Discharge Care Instructions        Start     Ordered   09/27/16 0000  aspirin EC 325 MG EC tablet  Daily     09/26/16 1520   09/27/16 0000  furosemide (LASIX) 40 MG tablet  Daily     09/26/16 1520   09/26/16 0000  Increase activity slowly     09/26/16 1520   09/26/16 0000  Diet - low sodium heart healthy     09/26/16 1520       Disposition: Pt will be discharged back to [facility] his respiratory status and blood pressure is stable for discharge.  Sonda Rumble, AGNP  Pulmonary/Critical Care Pager [phone] (please enter 7 digits) PCCM Consult Pager [phone] (please enter 7 digits)

## 2016-09-26 NOTE — Consult Note (Signed)
Name: Richard Odom MRN: [id number] DOB: 1921-02-02    ADMISSION DATE:  09/26/2016 CONSULTATION DATE: 09/26/2016  REFERRING MD : Dr. Winona Legato  CHIEF COMPLAINT: Shortness of Breath and Chest Pain   BRIEF PATIENT DESCRIPTION:  83 yo male admitted 08/23 with acute on chronic hypoxic hypercapnic respiratory failure secondary to possible CAP and AECOPD, mild pulmonary edema, and left greater than right bilateral pleural effusions requiring continuous Bipap   SIGNIFICANT EVENTS  08/23-Pt admitted to Stepdown Unit   STUDIES:  Echo 08/23>>  HISTORY OF PRESENT ILLNESS:   This is a 83 yo male with a PMH of Hypothyroidism, HTN, GERD, COPD, Constipation, Anxiety, and Angina.  He presented to [facility] ER 08/23 via EMS from [facility] with shortness of breath and hypoxia O2 sats in the 80's on RA.  The pt wears chronic home O2 at 4L via nasal canula he developed worsening shortness of breath with severe upper back pain onset of symptoms the night of 08/22.  Upon EMS arrival due to acute respiratory failure pt was started on CPAP and Nitropaste applied due to hypertension.  In the ER lab results revealed mildly elevated troponin 0.13, wbc 12.5, and hgb 10.0.  He was placed on continuous Bipap due to respiratory failure. Therefore, he was admitted to the [facility] Unit for further workup and treatment PCCM consulted.    PAST MEDICAL HISTORY :   has a past medical history of Angina pectoris (HCC); Anxiety; Constipation; COPD (chronic obstructive pulmonary disease) (HCC); GERD (gastroesophageal reflux disease); Hypertension; and Thyroid disease.  has no past surgical history on file. Prior to Admission medications   Medication Sig Start Date End Date Taking? Authorizing Provider  carvedilol (COREG CR) 10 MG 24 hr capsule Take 10 mg by mouth daily.   Yes [provider]  levothyroxine (SYNTHROID, LEVOTHROID) 25 MCG tablet Take 25 mcg by mouth daily before breakfast.   Yes [provider]  umeclidinium-vilanterol (ANORO ELLIPTA) 62.5-25 MCG/INH AEPB Inhale 1 puff into the lungs daily.   Yes [provider]  acetaminophen (TYLENOL) 500 MG tablet Take 500 mg by mouth every 12 (twelve) hours as needed.    [provider]  albuterol (PROVENTIL HFA;VENTOLIN HFA) 108 (90 Base) MCG/ACT inhaler Inhale 2 puffs into the lungs every 4 (four) hours as needed for wheezing or shortness of breath.    [provider]  alum & mag hydroxide-simeth (GERI-LANTA) 200-200-20 MG/5ML suspension Take 10 mLs by mouth every 6 (six) hours as needed for indigestion or heartburn.    [provider]  dextromethorphan-guaiFENesin (MUCINEX DM) 30-600 MG 12hr tablet Take 1 tablet by mouth 2 (two) times daily as needed for cough.    [provider]  ipratropium-albuterol (DUONEB) 0.5-2.5 (3) MG/3ML SOLN Take 3 mLs by nebulization every 4 (four) hours as needed.    [provider]  lactulose (CHRONULAC) 10 GM/15ML solution Take 10 g by mouth daily as needed for mild constipation.    [provider]  loperamide (IMODIUM) 2 MG capsule Take 2 mg by mouth as needed for diarrhea or loose stools.    [provider]  LORazepam (ATIVAN) 2 MG/ML concentrated solution Take 0.5 mg by mouth every 2 (two) hours as needed for anxiety.    [provider]  magnesium hydroxide (MILK OF MAGNESIA) 400 MG/5ML suspension Take 15 mLs by mouth 2 (two) times daily as needed for mild constipation.    [provider]  Morphine Sulfate (MORPHINE CONCENTRATE) 10 mg / 0.5 ml  concentrated solution Take by mouth every 2 (two) hours as needed for severe pain.    [provider]  nitroGLYCERIN (NITROSTAT) 0.4 MG SL tablet Place 0.4 mg under the tongue every 5 (five) minutes as needed for chest pain.    [provider]  ondansetron (ZOFRAN) 4 MG tablet Take 4 mg by mouth every 8 (eight) hours as needed for nausea or vomiting.    [provider]  sennosides-docusate sodium (SENOKOT-S) 8.6-50 MG tablet Take 1 tablet by mouth daily as needed for constipation.    [provider]  sodium chloride (OCEAN) 0.65 % SOLN nasal spray Place 2 sprays into both nostrils every 4 (four) hours as needed for congestion.    [provider]   No Known Allergies  FAMILY HISTORY:  family history is not on file. SOCIAL HISTORY:  reports that he has never smoked. He has never used smokeless tobacco. He reports that he does not drink alcohol or use drugs.  REVIEW OF SYSTEMS: Positives in BOLD  Constitutional: Negative for fever, chills, weight loss, malaise/fatigue and diaphoresis.  HENT: Negative for hearing loss, ear pain, nosebleeds, congestion, sore throat, neck pain, tinnitus and ear discharge.   Eyes: Negative for blurred vision, double vision, photophobia, pain, discharge and redness.  Respiratory: cough, hemoptysis, sputum production, shortness of breath, wheezing and stridor.   Cardiovascular: Negative for chest pain, palpitations, orthopnea, claudication, leg swelling and PND.  Gastrointestinal: Negative for heartburn, nausea, vomiting, abdominal pain, diarrhea, constipation, blood in stool and melena.  Genitourinary: Negative for dysuria, urgency, frequency, hematuria and flank pain.  Musculoskeletal: back and neck pain, joint pain and falls.  Skin: Negative for itching and rash.  Neurological: Negative for dizziness, tingling, tremors, sensory change, speech change, focal weakness, seizures, loss of consciousness, weakness and headaches.  Endo/Heme/Allergies: Negative for environmental allergies and polydipsia. Does not bruise/bleed easily.  SUBJECTIVE:  Pt denies chest pain c/o neck and back pain.  VITAL SIGNS: Temp:  [97.3 F (36.3 C)] 97.3 F (36.3 C) (08/23 0700) Pulse Rate:  [53-105] 53 (08/23 0900) Resp:  [17-32] 23 (08/23 0900) BP: (164-187)/(44-70) 164/51 (08/23 0900) SpO2:  [90 %-100 %] 90 %  (08/23 0900) Weight:  [72.6 kg (160 lb)] 72.6 kg (160 lb) (08/23 0702)  PHYSICAL EXAMINATION: General: well developed, well nourished male, NAD  Neuro: alert and oriented, follows commands  HEENT: supple, no JVD  Cardiovascular: sinus brady, no M/R/G Lungs: inspiratory wheezes, crackles, and rhonchi throughout, even, non labored on continuous Bipap  Abdomen: hypoactive BS x4, taut, mildly tender Musculoskeletal: 2+ bilateral lower extremity edema, normal tone  Skin: intact no rashes or lesions    Recent Labs Lab 09/26/16 0704  NA 140  K 4.8  CL 101  CO2 31  BUN 20  CREATININE 1.16  GLUCOSE 142*    Recent Labs Lab 09/26/16 0704  HGB 10.0*  HCT 30.9*  WBC 12.5*  PLT 132*   Dg Chest Portable 1 View  Result Date: 09/26/2016 CLINICAL DATA:  Shortness of breath, hypoxia.  History of COPD. EXAM: PORTABLE CHEST 1 VIEW COMPARISON:  None in PACs FINDINGS: The lungs are reasonably well inflated. Increased density is noted at both lung bases and there are small bilateral pleural effusions. The cardiac silhouette is the large but the heart borders are indistinct. The pulmonary vascularity is mildly engorged. There is calcification in the wall of the aortic arch. The bony thorax exhibits no acute abnormality. IMPRESSION: CHF with interstitial edema and bilateral pleural effusions. Coarse  bibasilar lung markings may reflect atelectasis or pneumonia. When the patient can tolerate the procedure, a PA and lateral chest x-ray would be useful. Thoracic aortic atherosclerosis. Electronically Signed   By: David  [place] M.D.   On: 09/26/2016 07:22    ASSESSMENT / PLAN: Acute on chronic hypoxic hypercapnic respiratory failure secondary to possible CAP, mild pulmonary edema, AECOPD, and left greater than right bilateral pleural effusions Hypertension  Leukocytosis Elevated troponin's secondary to demand ischemia vs. NSTEMI  Hx: COPD  P: Continue Bipap for now wean as tolerated Maintain O2 sats  88% to 92% Repeat CXR in am  Continue bronchodilator therapy, will add nebulized steroids  Continue IV steroids  Trend troponin's Continuous telemetry monitoring  Echo pending  Continue nitroglycerin gtt for bp control and/or chest pain resolution  IV hydralazine x1 dose  Continue po metoprolol and aspirin  Trend WBC and monitor fever curve Trend PCT  Will discontinue iv levaquin will start iv ceftriaxone and azithromycin  Follow sputum culture   Sonda Rumble, AGNP  Pulmonary/Critical Care Pager [phone] (please enter 7 [phone]) PCCM Consult Pager [phone] (please enter 7 [phone])   PCCM ATTENDING ATTESTATION:  I have evaluated patient with the APP [place], reviewed database in its entirety and discussed care plan in detail. In addition, this patient was discussed on multidisciplinary rounds.   Important exam findings: Cognition appears intact to the extent that I can tell (language barrier) NAD on BiPAP No wheezes, bibasilar crackles Reg, no M NABS Symmetric edema  Major problems addressed by PCCM team: Acute hypoxic respiratory  Pulmonary edema pattern on CXR Severe hypertension Advanced age Dementia   PLAN/REC: Cont PRN BiPAP Cont supplemental O2 Diuresis as permitted by BP and renal function NTG gtt Echocardiogram Empiric abx for possible PNA. DC if PCT remains low   Billy Fischer, MD PCCM service Mobile [phone] Pager [phone] 09/26/2016 2:59 PM

## 2016-09-26 NOTE — Progress Notes (Addendum)
Pt admitted to ICU 5, BiPap increased to 50%, pt very talkative, dis to time only, appears to be continent, admission done with Med Interpreter Myriam Jacobson.  Denies CP, 30 mcgs nitro infusing

## 2016-09-27 LAB — ECHOCARDIOGRAM COMPLETE
Height: 65 in
Weight: 2158.74 oz

## 2017-08-04 DEATH — deceased

## 2018-01-25 IMAGING — DX DG CHEST 1V PORT
1 series · 1 of 1 positions shown · non-contrast
Comparison: None in PACs

CLINICAL DATA: Shortness of breath, hypoxia.  History of COPD.

EXAM:
PORTABLE CHEST 1 VIEW

[chest ap]
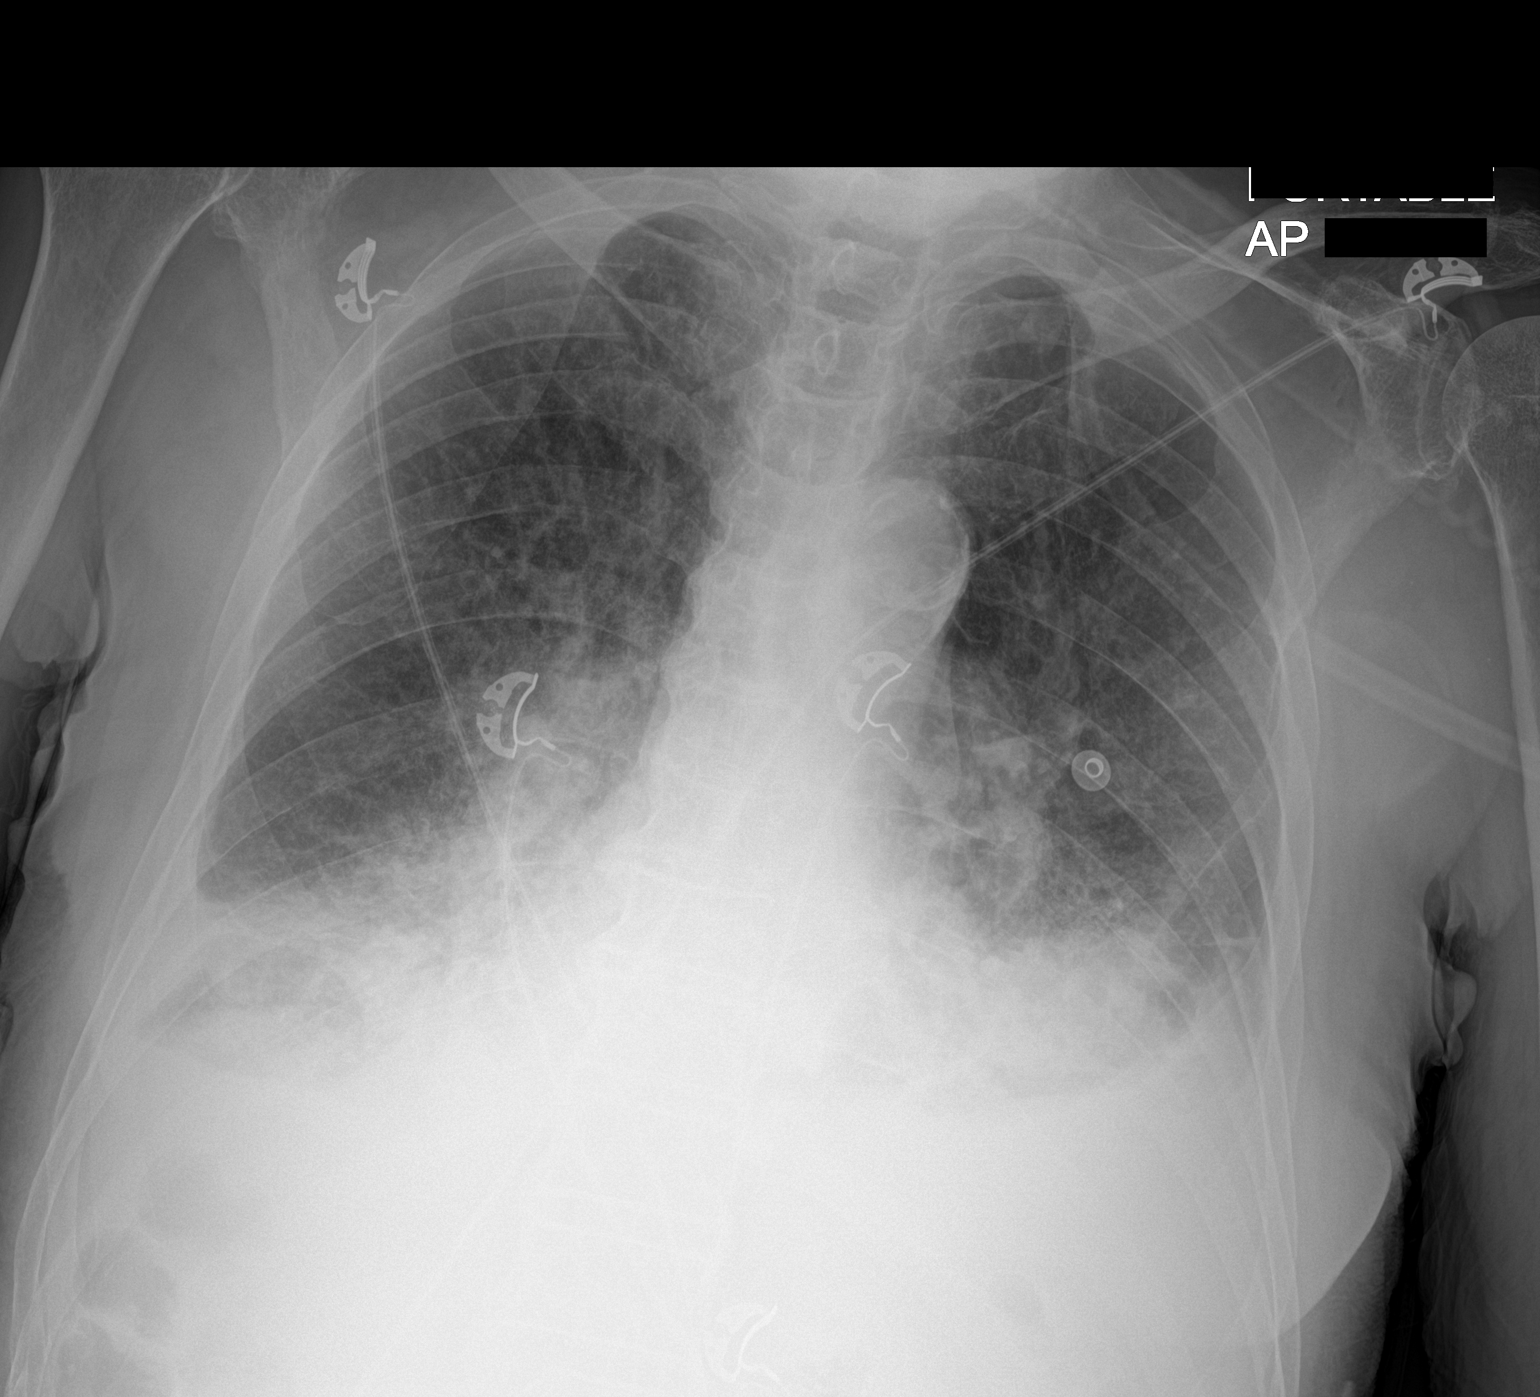

[1 of 1 positions shown; findings below may reference images not displayed]

FINDINGS: The lungs are reasonably well inflated. Increased density is noted
at both lung bases and there are small bilateral pleural effusions.
The cardiac silhouette is the large but the heart borders are
indistinct. The pulmonary vascularity is mildly engorged. There is
calcification in the wall of the aortic arch. The bony thorax
exhibits no acute abnormality.
IMPRESSION: CHF with interstitial edema and bilateral pleural effusions. Coarse
bibasilar lung markings may reflect atelectasis or pneumonia. When
the patient can tolerate the procedure, a PA and lateral chest x-ray
would be useful.

Thoracic aortic atherosclerosis.
